# Patient Record
Sex: Female | Born: 1980 | Race: White | Hispanic: No | Marital: Single | Smoking: Never smoker
Health system: Southern US, Community
[De-identification: ages and names within clinical notes are randomized; demographics above are authoritative.]

## PROBLEM LIST (undated history)

## (undated) DIAGNOSIS — E079 Disorder of thyroid, unspecified: Secondary | ICD-10-CM

## (undated) DIAGNOSIS — E7800 Pure hypercholesterolemia, unspecified: Secondary | ICD-10-CM

---

## 2005-10-27 ENCOUNTER — Other Ambulatory Visit: Admission: RE | Admit: 2005-10-27 | Discharge: 2005-10-27 | Payer: Self-pay | Admitting: Obstetrics and Gynecology

## 2010-10-08 ENCOUNTER — Inpatient Hospital Stay (HOSPITAL_COMMUNITY)
Admission: RE | Admit: 2010-10-08 | Discharge: 2010-10-10 | Payer: Self-pay | Source: Home / Self Care | Attending: Obstetrics and Gynecology | Admitting: Obstetrics and Gynecology

## 2010-10-08 LAB — CBC
HCT: 35.3 % — ABNORMAL LOW (ref 36.0–46.0)
Hemoglobin: 11.7 g/dL — ABNORMAL LOW (ref 12.0–15.0)
MCHC: 33.1 g/dL (ref 30.0–36.0)
RBC: 4.11 MIL/uL (ref 3.87–5.11)

## 2010-10-08 LAB — RPR: RPR Ser Ql: NONREACTIVE

## 2010-10-09 LAB — ABO/RH: ABO/RH(D): O POS

## 2010-10-09 LAB — CBC
HCT: 27 % — ABNORMAL LOW (ref 36.0–46.0)
Hemoglobin: 9.1 g/dL — ABNORMAL LOW (ref 12.0–15.0)
RBC: 3.16 MIL/uL — ABNORMAL LOW (ref 3.87–5.11)
WBC: 11.8 10*3/uL — ABNORMAL HIGH (ref 4.0–10.5)

## 2010-10-14 NOTE — Discharge Summary (Signed)
NAMEThurman Hickman NO.:  [id number]  MEDICAL RECORD NO.:  [id number]          PATIENT TYPE:  INP  LOCATION:  9125                          FACILITY:  [facility]  PHYSICIAN:  Malachi Pro. Ambrose Mantle, M.D. DATE OF BIRTH:  01-Feb-1981  DATE OF ADMISSION:  10/08/2010 DATE OF DISCHARGE:  10/10/2010                              DISCHARGE SUMMARY   This is a 30 year old white female para 0, gravida 1 at [redacted] weeks gestation, [facility] October 08, 2010 by last period compatible with an 8-week ultrasound presented to Labor and Delivery for induction of labor because of term status and recently favorable cervix.  Prenatal care was uneventful except for positive group B strep status.  She failed a 1- hour Glucola test.  The 3-hour GTT was completely normal.  She did smoke with pregnancy with no more than 2 cigarettes a day.  Blood group and type O+ with negative antibody, rubella immune, RPR nonreactive, hepatitis B surface antigen negative, HIV negative, GC and Chlamydia negative, cystic fibrosis declined, AFP negative.  First trimester screen negative, 1-hour Glucola 147, 3-hour GTT 82, 124, 115, and 102. Group B strep was positive.  She had a history of abnormal paps that resolved.  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  She did have a history of scoliosis.  She had had a myringotomy and tonsillectomy.  She used tobacco intermittently.  She did not drink or take drugs.  She was a Runner, broadcasting/film/video  FAMILY HISTORY:  Grandmother with colon cancer; father, heart disease, died from an MI.  Father and brother had increased cholesterol.  She had no known drug allergies.  MEDICATIONS:  Prenatal vitamins.  On admission, she was afebrile with normal vital signs.  Heart and lungs were normal.  The abdomen was gravid and nontender.  Cervix was a tight 2 cm, 50% vertex at -1.  Artificial rupture of membranes produced probable clear fluid.  She was on group B strep prophylaxis with penicillin.  She was  begun on Pitocin.  By 1:30 p.m., her cervix was 2-3 cm, 75% effaced.  By 6:50 p.m., she had gotten uncomfortable and received an epidural and at 6:00 p.m. was 6 cm.  She did have variable decelerations to 70-80 but decreased blood pressure from the epidural, improved with position change and ephedrine.  At 6:50 p.m., she was 9 cm, fully effaced and vertex at  0 to +1 station.  She progressed to complete dilatation and pushed great for approximately 30 minutes with a spontaneous delivery of a vigorous female infant weighing 6 pounds 8 ounces, Apgars of 8 and 9 at 1 and 5 minutes.  Dr. Senaida Ores was in attendance.  Cord blood was collected and the placenta was delivered spontaneously.  Second-degree laceration noted and repaired with 2-0 and 3-0 Vicryl.  Two additional sutures were placed on the inner right labia for an arterial bleeder with good hemostasis.  Cervix and rectum were intact.  Blood loss about 450 mL.  Postpartum, the patient did well and was discharged on the second postpartum day.  Initial hemoglobin was 11.7, hematocrit 35.3, white count 8900, platelet count 217,000. Followup hemoglobin  9.1, hematocrit 27.0, platelet count of 191,000.  FINAL DIAGNOSIS:  Intrauterine pregnancy at 40 weeks, delivered vertex.  OPERATION:  Spontaneous delivery vertex, repair of second-degree laceration and right labial laceration.  FINAL CONDITION:  Improved.  Instructions include our regular discharge instruction booklet.  The patient is given a prescription for Motrin 600 mg, 30 tablets 1 every 6 hours as needed for pain and Percocet 5/325, 30 tablets 1 every 4-6 hours as needed for pain.  She is advised to take ferrous sulfate 325 mg 2 tablets daily and return to the office in 6 weeks for followup examination.     Malachi Pro. Ambrose Mantle, M.D.     TFH/MEDQ  D:  10/10/2010  T:  10/11/2010  Job:  [id number]  Electronically Signed by Tracey Harries M.D. on 10/14/2010 08:36:37 AM

## 2011-01-20 ENCOUNTER — Emergency Department (HOSPITAL_COMMUNITY)
Admission: EM | Admit: 2011-01-20 | Discharge: 2011-01-20 | Disposition: A | Discharge status: Home / Self Care | Payer: BC Managed Care – PPO | Attending: Emergency Medicine | Admitting: Emergency Medicine

## 2011-01-20 DIAGNOSIS — M7989 Other specified soft tissue disorders: Secondary | ICD-10-CM

## 2011-01-20 DIAGNOSIS — R109 Unspecified abdominal pain: Secondary | ICD-10-CM | POA: Insufficient documentation

## 2011-01-20 DIAGNOSIS — M25569 Pain in unspecified knee: Secondary | ICD-10-CM | POA: Insufficient documentation

## 2011-01-20 DIAGNOSIS — M549 Dorsalgia, unspecified: Secondary | ICD-10-CM | POA: Insufficient documentation

## 2011-01-20 LAB — COMPREHENSIVE METABOLIC PANEL
AST: 31 U/L (ref 0–37)
Albumin: 4.3 g/dL (ref 3.5–5.2)
BUN: 9 mg/dL (ref 6–23)
Creatinine, Ser: 0.75 mg/dL (ref 0.4–1.2)
GFR calc Af Amer: >60 mL/min (ref >60)
Potassium: 3.5 mEq/L (ref 3.5–5.1)
Total Protein: 7.5 g/dL (ref 6.0–8.3)

## 2011-01-20 LAB — POCT PREGNANCY, URINE: Preg Test, Ur: NEGATIVE

## 2011-01-20 LAB — DIFFERENTIAL
Eosinophils Absolute: 0.3 10*3/uL (ref 0.0–0.7)
Lymphocytes Relative: 46 % (ref 12–46)
Lymphs Abs: 2.8 10*3/uL (ref 0.7–4.0)
Monocytes Relative: 7 % (ref 3–12)
Neutro Abs: 2.6 10*3/uL (ref 1.7–7.7)
Neutrophils Relative %: 43 % (ref 43–77)

## 2011-01-20 LAB — CBC
Hemoglobin: 13.8 g/dL (ref 12.0–15.0)
MCH: 27.7 pg (ref 26.0–34.0)
MCV: 85 fL (ref 78.0–100.0)
Platelets: 269 10*3/uL (ref 150–400)
RBC: 4.99 MIL/uL (ref 3.87–5.11)
WBC: 6.1 10*3/uL (ref 4.0–10.5)

## 2011-01-20 LAB — URINALYSIS, ROUTINE W REFLEX MICROSCOPIC
Bilirubin Urine: NEGATIVE
Glucose, UA: NEGATIVE mg/dL
Leukocytes, UA: NEGATIVE
Nitrite: NEGATIVE
Specific Gravity, Urine: 1.009 (ref 1.005–1.030)
pH: 6.5 (ref 5.0–8.0)

## 2011-01-20 LAB — URINE MICROSCOPIC-ADD ON

## 2021-01-03 ENCOUNTER — Emergency Department (HOSPITAL_BASED_OUTPATIENT_CLINIC_OR_DEPARTMENT_OTHER)
Admission: EM | Admit: 2021-01-03 | Discharge: 2021-01-03 | Disposition: A | Discharge status: Home / Self Care | Payer: BC Managed Care – PPO | Attending: Emergency Medicine | Admitting: Emergency Medicine

## 2021-01-03 ENCOUNTER — Other Ambulatory Visit: Payer: Self-pay

## 2021-01-03 ENCOUNTER — Encounter (HOSPITAL_BASED_OUTPATIENT_CLINIC_OR_DEPARTMENT_OTHER): Payer: Self-pay | Admitting: Emergency Medicine

## 2021-01-03 ENCOUNTER — Emergency Department (HOSPITAL_BASED_OUTPATIENT_CLINIC_OR_DEPARTMENT_OTHER): Payer: BC Managed Care – PPO

## 2021-01-03 DIAGNOSIS — Z23 Encounter for immunization: Secondary | ICD-10-CM | POA: Insufficient documentation

## 2021-01-03 DIAGNOSIS — S61211A Laceration without foreign body of left index finger without damage to nail, initial encounter: Secondary | ICD-10-CM

## 2021-01-03 DIAGNOSIS — W268XXA Contact with other sharp object(s), not elsewhere classified, initial encounter: Secondary | ICD-10-CM | POA: Insufficient documentation

## 2021-01-03 DIAGNOSIS — S6992XA Unspecified injury of left wrist, hand and finger(s), initial encounter: Secondary | ICD-10-CM | POA: Diagnosis present

## 2021-01-03 HISTORY — DX: Pure hypercholesterolemia, unspecified: E78.00

## 2021-01-03 HISTORY — DX: Disorder of thyroid, unspecified: E07.9

## 2021-01-03 MED ORDER — TETANUS-DIPHTH-ACELL PERTUSSIS 5-2.5-18.5 LF-MCG/0.5 IM SUSY
0.5000 mL | PREFILLED_SYRINGE | Freq: Once | INTRAMUSCULAR | Status: AC
  Administered 2021-01-03: 0.5 mL via INTRAMUSCULAR
  Filled 2021-01-03: qty 0.5

## 2021-01-03 MED ORDER — LIDOCAINE HCL (PF) 1 % IJ SOLN
5.0000 mL | Freq: Once | INTRAMUSCULAR | Status: AC
  Administered 2021-01-03: 5 mL
  Filled 2021-01-03: qty 5

## 2021-01-03 NOTE — ED Provider Notes (Signed)
[facility] EMERGENCY DEPARTMENT Provider Note   CSN: [id number] Arrival date & time: 01/03/21  1932     History Chief Complaint  Patient presents with  . Extremity Laceration    Kimberly Hickman is a 40 y.o. female with a past medical history significant for hyperlipidemia and thyroid disease who presents to the ED due to finger laceration.  Patient states she was cutting the bushes causing a laceration to left index finger.  Patient rates her pain a 3/10 worse with movement. No treatment prior to arrival. No nailbed injury. Last tetanus shot roughly 10 years ago. Admits to mild tingling around laceration.   History obtained from patient and past medical records. No interpreter used during encounter.      Past Medical History:  Diagnosis Date  . Elevated cholesterol   . Thyroid disease     There are no problems to display for this patient.   History reviewed. No pertinent surgical history.   OB History   No obstetric history on file.     No family history on file.  Social History   Tobacco Use  . Smoking status: Never Smoker  . Smokeless tobacco: Never Used  Vaping Use  . Vaping Use: Never used  Substance Use Topics  . Alcohol use: Yes    Alcohol/week: 2.0 standard drinks    Types: 2 Cans of beer per week    Comment: daily  . Drug use: Never    Home Medications Prior to Admission medications   Not on File    Allergies    Patient has no allergy information on record.  Review of Systems   Review of Systems  Musculoskeletal: Positive for arthralgias.  Skin: Positive for wound.  All other systems reviewed and are negative.   Physical Exam Updated Vital Signs BP 135/82 (BP Location: Right Arm)   Pulse 64   Temp 98 F (36.7 C) (Oral)   Resp 18   Ht 5\' 5"  (1.651 m)   Wt 83.9 kg   LMP 12/10/2020   SpO2 100%   BMI 30.79 kg/m   Physical Exam Vitals and nursing note reviewed.  Constitutional:      General: She is not in acute  distress.    Appearance: She is not ill-appearing.  HENT:     Head: Normocephalic.  Eyes:     Pupils: Pupils are equal, round, and reactive to light.  Cardiovascular:     Rate and Rhythm: Normal rate and regular rhythm.     Pulses: Normal pulses.     Heart sounds: Normal heart sounds. No murmur heard. No friction rub. No gallop.   Pulmonary:     Effort: Pulmonary effort is normal.     Breath sounds: Normal breath sounds.  Abdominal:     General: Abdomen is flat. There is no distension.     Palpations: Abdomen is soft.     Tenderness: There is no abdominal tenderness. There is no guarding or rebound.  Musculoskeletal:        General: Normal range of motion.     Cervical back: Neck supple.     Comments: Full ROM of left index finger and all fingers. Full ROM of left wrist. Radial pulse palpable.   Skin:    General: Skin is warm and dry.     Comments: 3cm laceration to palmar aspect of left index finger that wraps around. No nailbed injury.   Neurological:     General: No focal deficit present.  Mental Status: She is alert.  Psychiatric:        Mood and Affect: Mood normal.        Behavior: Behavior normal.         ED Results / Procedures / Treatments   Labs (all labs ordered are listed, but only abnormal results are displayed) Labs Reviewed - No data to display  EKG None  Radiology DG Finger Index Left  Result Date: 01/03/2021 CLINICAL DATA:  Laceration to the left index finger on the palmar surface. EXAM: LEFT INDEX FINGER 2+V COMPARISON:  None. FINDINGS: Soft tissue defect over the volar aspect of the distal left second finger consistent with history of laceration. No radiopaque soft tissue foreign bodies. Underlying bones appear intact. No evidence of acute fracture or dislocation. Joint spaces are normal. IMPRESSION: Soft tissue laceration. No radiopaque soft tissue foreign bodies. No acute bony abnormalities. Electronically Signed   By: Burman Nieves M.D.    On: 01/03/2021 20:45    Procedures .Marland KitchenLaceration Repair  Date/Time: 01/03/2021 9:30 PM Performed by: Mannie Stabile, PA-C Authorized by: Mannie Stabile, PA-C   Consent:    Consent obtained:  Verbal   Consent given by:  Patient   Risks discussed:  Infection, need for additional repair, pain, poor cosmetic result and poor wound healing   Alternatives discussed:  No treatment and delayed treatment Universal protocol:    Procedure explained and questions answered to patient or proxy's satisfaction: yes     Relevant documents present and verified: yes     Test results available: yes     Imaging studies available: yes     Required blood products, implants, devices, and special equipment available: yes     Site/side marked: yes     Immediately prior to procedure, a time out was called: yes     Patient identity confirmed:  Verbally with patient Anesthesia:    Anesthesia method:  Local infiltration   Local anesthetic:  Lidocaine 1% w/o epi Laceration details:    Location:  Finger   Finger location:  L index finger   Length (cm):  3   Depth (mm):  2 Pre-procedure details:    Preparation:  Patient was prepped and draped in usual sterile fashion and imaging obtained to evaluate for foreign bodies Exploration:    Limited defect created (wound extended): no     Hemostasis achieved with:  Direct pressure   Imaging obtained: x-ray     Imaging outcome: foreign body not noted     Wound exploration: wound explored through full range of motion and entire depth of wound visualized     Wound extent: no foreign bodies/material noted, no tendon damage noted and no underlying fracture noted     Contaminated: no   Treatment:    Area cleansed with:  Saline   Amount of cleaning:  Standard   Irrigation solution:  Sterile saline   Irrigation volume:  250   Irrigation method:  Syringe   Visualized foreign bodies/material removed: no   Skin repair:    Repair method:  Sutures   Suture size:   4-0   Suture material:  Prolene   Number of sutures:  4 Approximation:    Approximation:  Close Repair type:    Repair type:  Intermediate Post-procedure details:    Dressing:  Non-adherent dressing and splint for protection   Procedure completion:  Tolerated well, no immediate complications     Medications Ordered in ED Medications  Tdap (BOOSTRIX) injection 0.5  mL (0.5 mLs Intramuscular Given 01/03/21 2007)  lidocaine (PF) (XYLOCAINE) 1 % injection 5 mL (5 mLs Infiltration Given by Other 01/03/21 2011)    ED Course  I have reviewed the triage vital signs and the nursing notes.  Pertinent labs & imaging results that were available during my care of the patient were reviewed by me and considered in my medical decision making (see chart for details).    MDM Rules/Calculators/A&P                         40 year old female presents to the ED due to left index finger laceration that occurred while cutting bushes. Stable vitals. 3cm laceration to palmar portion of left index finger. Full ROM. Low suspicion for tendon involvement. No nailbed involvement. X-ray personally reviewed which is negative for any acute abnormalities. Laceration thoroughly cleaned and suture repair performed as noted above. Tetanus updated.  Instructed patient to take over-the-counter ibuprofen or Tylenol as needed for pain.  Hand surgery number given to patient at discharge.  Instructed patient to follow-up with hand surgeon within the next week for further evaluation. Strict ED precautions discussed with patient. Patient states understanding and agrees to plan. Patient discharged home in no acute distress and stable vitals.  Final Clinical Impression(s) / ED Diagnoses Final diagnoses:  Laceration of left index finger without foreign body without damage to nail, initial encounter    Rx / DC Orders ED Discharge Orders    None       Jesusita Oka 01/03/21 2133    Charlynne Pander,  MD 01/04/21 [phone]

## 2021-01-03 NOTE — Discharge Instructions (Addendum)
You will need your sutures removed in 10 to 14 days.  I have included the number of the hand surgeon.  Please call to schedule an appointment for further evaluation.  You may take over-the-counter ibuprofen or Tylenol as needed for pain.  Return to the ER for new or worsening symptoms.

## 2021-01-03 NOTE — ED Notes (Signed)
Finger splint placed on left hand pointer finger by Trinna Post, ED Tech.

## 2021-01-03 NOTE — ED Triage Notes (Signed)
Reports she cut her left pointer finger while cutting the bushes.  Bleeding controlled currently.  Reports tetanus in last ten years.

## 2021-07-16 ENCOUNTER — Other Ambulatory Visit: Payer: Self-pay | Admitting: Obstetrics and Gynecology

## 2021-07-16 DIAGNOSIS — R928 Other abnormal and inconclusive findings on diagnostic imaging of breast: Secondary | ICD-10-CM

## 2021-08-10 ENCOUNTER — Ambulatory Visit
Admission: RE | Admit: 2021-08-10 | Discharge: 2021-08-10 | Disposition: A | Discharge status: Home / Self Care | Payer: BC Managed Care – PPO | Source: Ambulatory Visit | Attending: Obstetrics and Gynecology | Admitting: Obstetrics and Gynecology

## 2021-08-10 ENCOUNTER — Ambulatory Visit: Payer: BC Managed Care – PPO

## 2021-08-10 DIAGNOSIS — R928 Other abnormal and inconclusive findings on diagnostic imaging of breast: Secondary | ICD-10-CM

## 2023-05-30 IMAGING — MG MM DIGITAL DIAGNOSTIC UNILAT*R* W/ TOMO W/ CAD
6 of 10 series · 6 of 30 positions shown · non-contrast
Comparison: Baseline mammogram 07/13/2021.

CLINICAL DATA: Recall from baseline screening mammography, possible
asymmetries in the RIGHT breast.

EXAM:
DIGITAL DIAGNOSTIC UNILATERAL RIGHT MAMMOGRAM WITH TOMOSYNTHESIS AND
CAD
TECHNIQUE: Right digital diagnostic mammography and breast tomosynthesis was
performed. The images were evaluated with computer-aided detection.

[R CC synth-2D (1 of 2)]
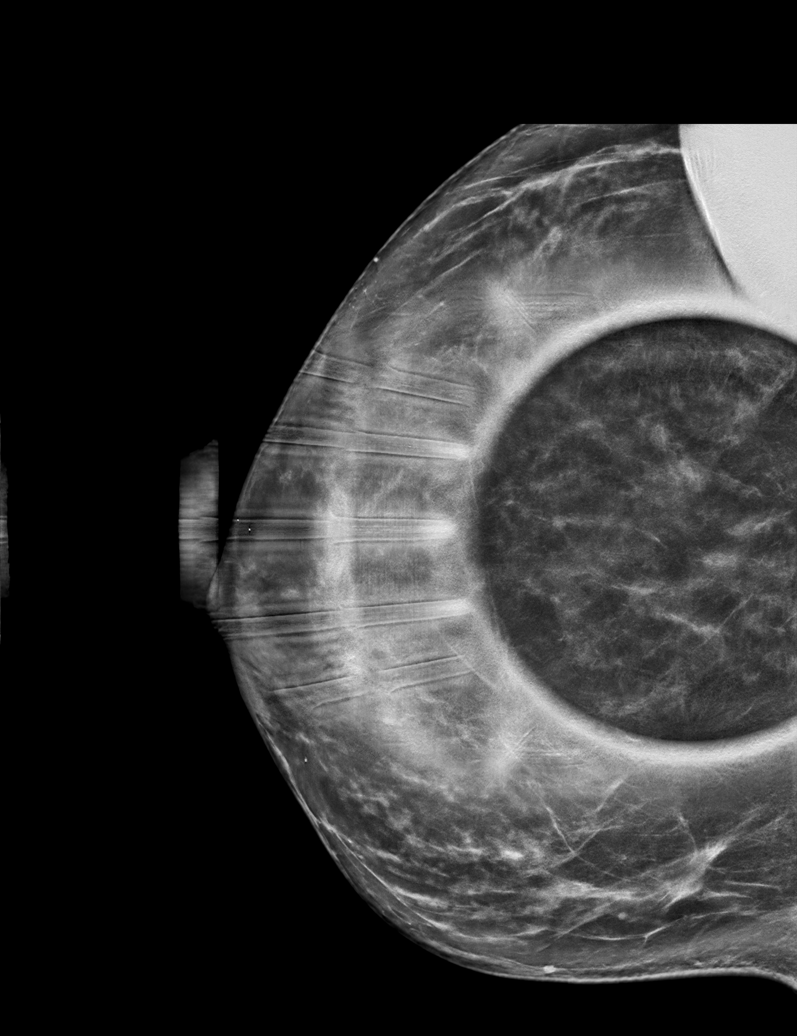

[R ML synth-2D]
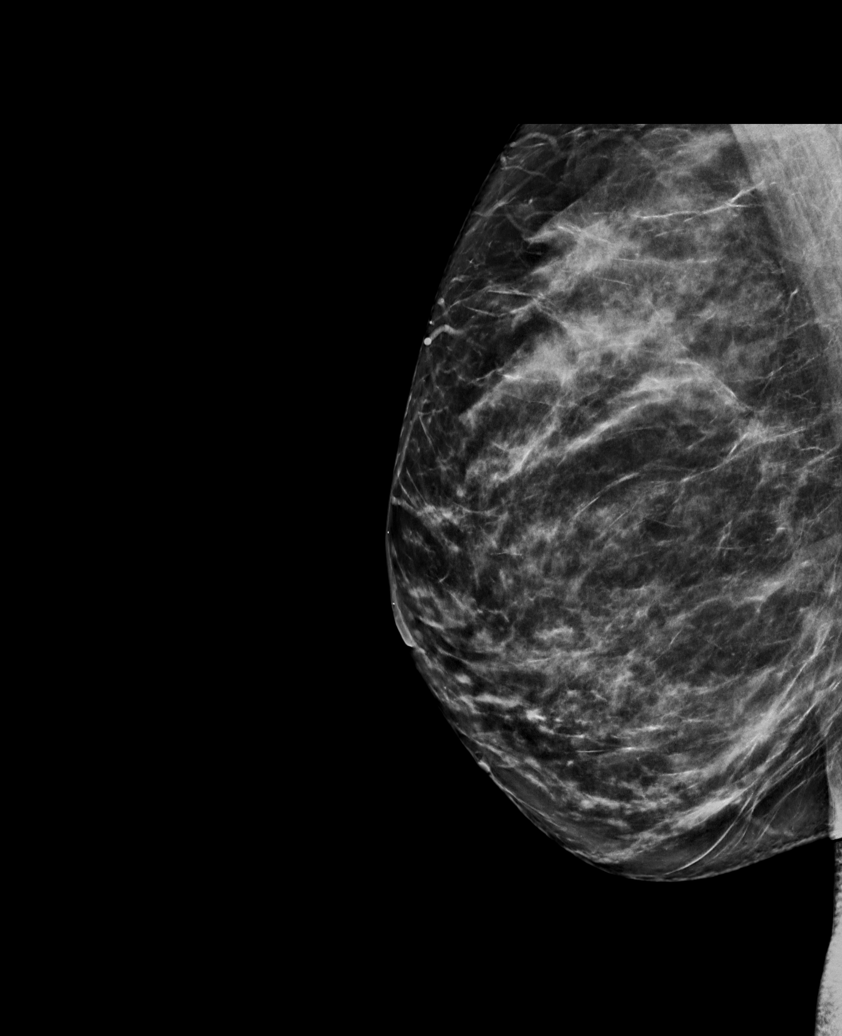

[R CC synth-2D (2 of 2)]
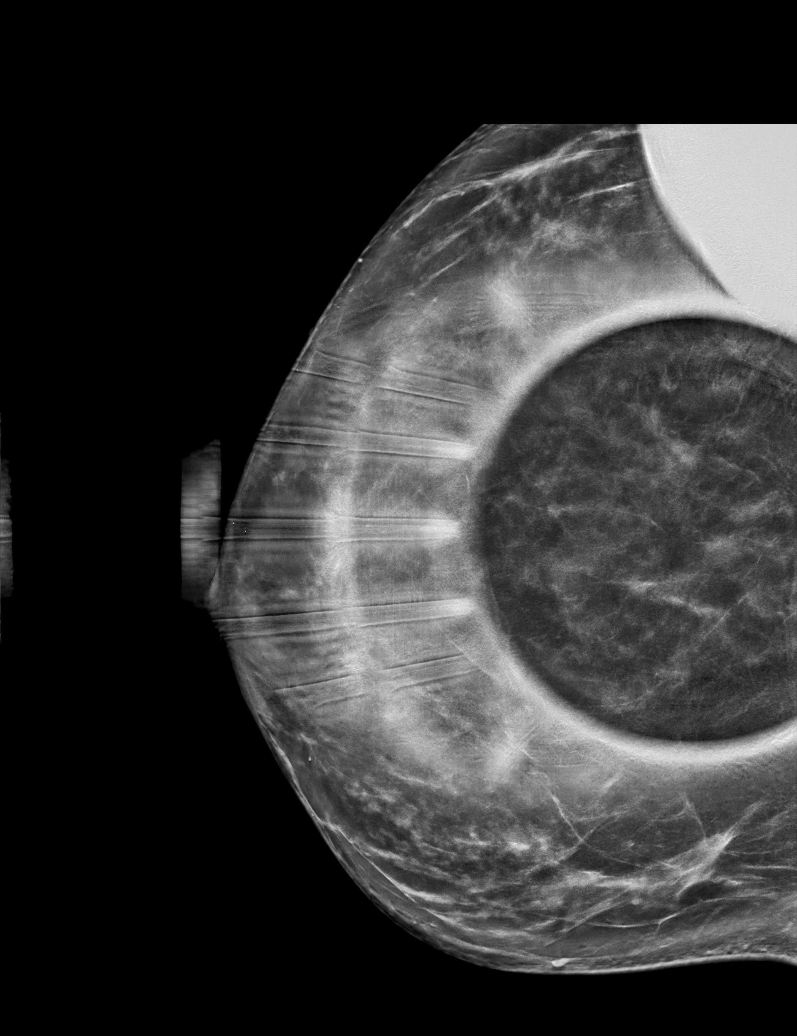

[R MLO synth-2D (1 of 2)]
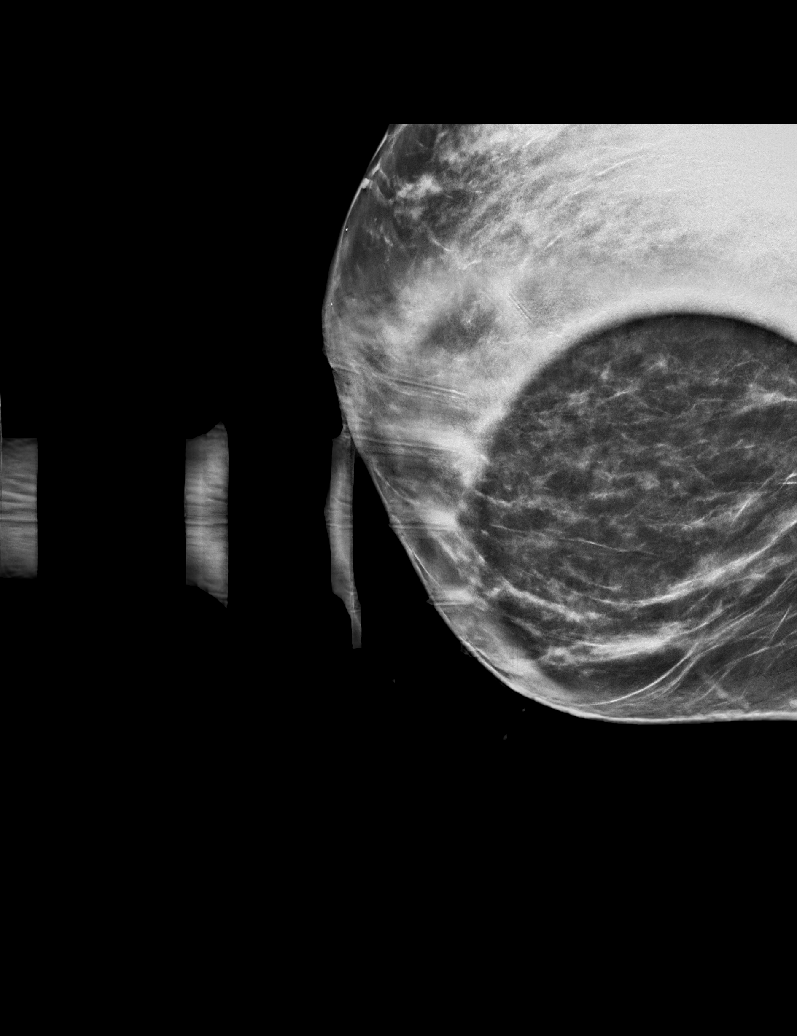

[R MLO synth-2D (2 of 2)]
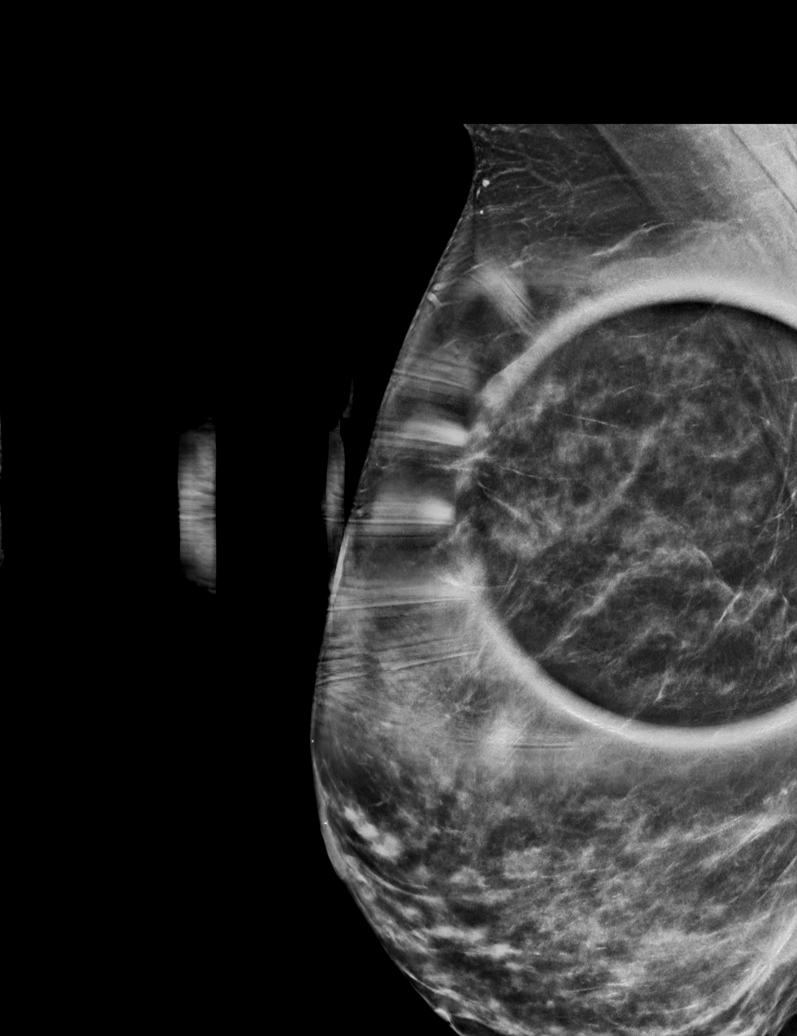

[R MLO tomo · tomo slice 38/75.0]
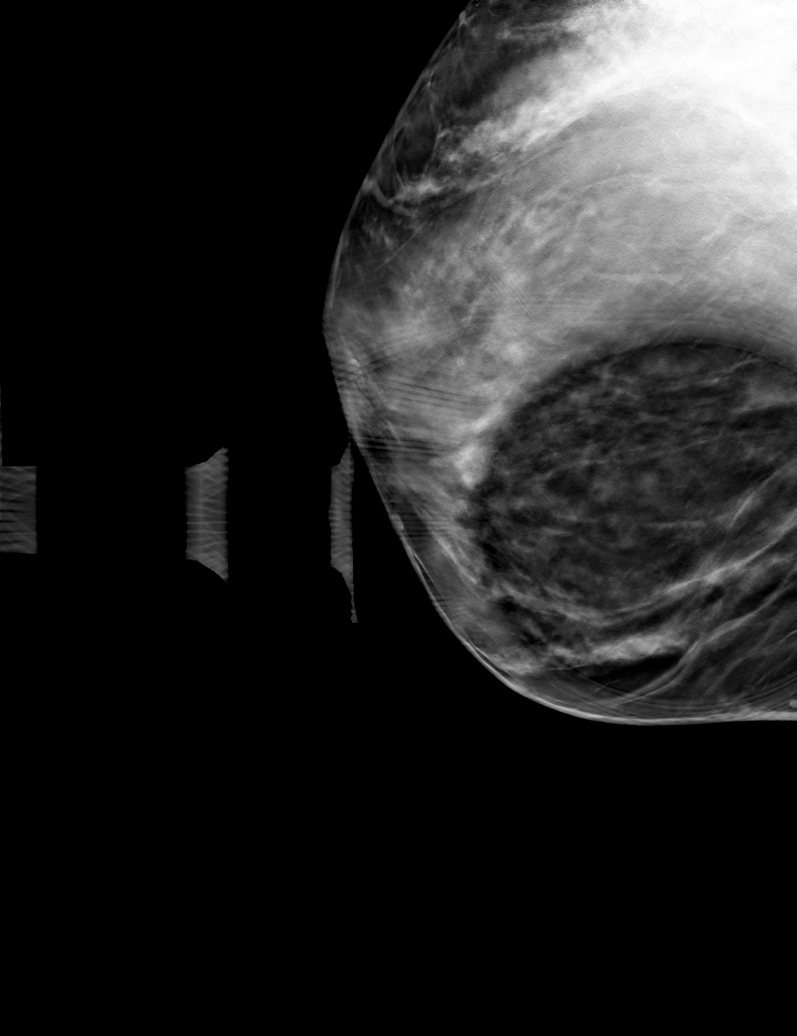

[6 of 30 positions shown; findings below may reference images not displayed]

ACR Breast Density Category c: The breast tissue is heterogeneously
dense, which may obscure small masses.
FINDINGS: Spot-compression CC and MLO views of the area of concern and a full
field mediolateral view were obtained.

The focal asymmetry in upper breast at posterior depth and in the
asymmetry in the lower breast at middle depth both disperse upon
compression, indicating overlapping fibroglandular tissue. There is
no underlying mass or architectural distortion at either location.

No findings suspicious for malignancy.
IMPRESSION: No mammographic evidence of malignancy involving the RIGHT breast.

RECOMMENDATION:
Screening mammogram in one year.(Code:[id number]-[id number])

I have discussed the findings and recommendations with the patient.
If applicable, a reminder letter will be sent to the patient
regarding the next appointment.

BI-RADS CATEGORY  1: Negative.

## 2023-12-15 ENCOUNTER — Other Ambulatory Visit: Payer: Self-pay

## 2023-12-15 ENCOUNTER — Ambulatory Visit: Payer: 59 | Attending: Obstetrics and Gynecology | Admitting: Physical Therapy

## 2023-12-15 DIAGNOSIS — R293 Abnormal posture: Secondary | ICD-10-CM | POA: Diagnosis present

## 2023-12-15 DIAGNOSIS — M6281 Muscle weakness (generalized): Secondary | ICD-10-CM | POA: Insufficient documentation

## 2023-12-15 DIAGNOSIS — M62838 Other muscle spasm: Secondary | ICD-10-CM | POA: Insufficient documentation

## 2023-12-15 DIAGNOSIS — R279 Unspecified lack of coordination: Secondary | ICD-10-CM | POA: Insufficient documentation

## 2023-12-15 NOTE — Therapy (Signed)
 OUTPATIENT PHYSICAL THERAPY FEMALE PELVIC EVALUATION   Patient Name: Kimberly Hickman MRN: [id number] DOB:18-Oct-1980, 43 y.o., female Today's Date: 12/15/2023  END OF SESSION:  PT End of Session - 12/15/23 1452     Visit Number 1    Date for PT Re-Evaluation 06/15/24    Authorization Type [employer]    PT Start Time 1446    PT Stop Time 1528    PT Time Calculation (min) 42 min    Activity Tolerance Patient tolerated treatment well    Behavior During Therapy WFL for tasks assessed/performed             Past Medical History:  Diagnosis Date   Elevated cholesterol    Thyroid disease    No past surgical history on file. There are no active problems to display for this patient.   PCP: Laqueta Due., MD   REFERRING PROVIDER: Arby Barrette, FNP  REFERRING DIAG: N39.3 (ICD-10-CM) - Stress incontinence (female) (female)  THERAPY DIAG:  Muscle weakness (generalized)  Abnormal posture  Unspecified lack of coordination  Other muscle spasm  Rationale for Evaluation and Treatment: Rehabilitation  ONSET DATE: 3 years ago  SUBJECTIVE:                                                                                                                                                                                           SUBJECTIVE STATEMENT: Urinary incontinence with sneezing, coughing, working out with heavy weights(100# squats), squatting, jumping  Fluid intake: water - 64-90oz; coffee thermos once per day  PAIN:  Are you having pain? No   PRECAUTIONS: None  RED FLAGS: None   WEIGHT BEARING RESTRICTIONS: No  FALLS:  Has patient fallen in last 6 months? No  OCCUPATION: kindergarten teacher   ACTIVITY LEVEL : active 6 days working out  PLOF: Independent  PATIENT GOALS: to have less leakage  PERTINENT HISTORY:  1 vaginal birth Sexual abuse: No  BOWEL MOVEMENT: Pain with bowel movement: No Type of bowel movement:Type (Bristol Stool Scale) 4, Frequency  daily, and Strain no Fully empty rectum: Yes:   Leakage: No Pads: No Fiber supplement/laxative No  URINATION: Pain with urination: No Fully empty bladder: Yes:   Stream: Strong Urgency: No Frequency: not quicker than every 2 hours, not nightly usually sometimes 1x Leakage: Coughing, Sneezing, Laughing, Exercise, Lifting, and Intercourse Pads: Yes: liners changed with going to the bathroom but this for more hygiene not leakage for work but for cardio dayswill have leakage and need larger pad  INTERCOURSE:  Ability to have vaginal penetration No  Pain with intercourse: none DrynessNo Climax: not painful Marinoff Scale: 0/3  PREGNANCY: Vaginal deliveries 1 Tearing Yes: thinks grade 3 almost 4  Episiotomy No C-section deliveries 0 Currently pregnant No  PROLAPSE: None   OBJECTIVE:  Note: Objective measures were completed at Evaluation unless otherwise noted.  DIAGNOSTIC FINDINGS:     COGNITION: Overall cognitive status: Within functional limits for tasks assessed     SENSATION: Light touch: Appears intact  FUNCTIONAL TESTS:  Functional squat - noted breath holding with return to standing and minimal core activation   GAIT: WFL  POSTURE: rounded shoulders and forward head   LUMBARAROM/PROM:  A/PROM A/PROM  eval  Flexion WFL  Extension WFL  Right lateral flexion WFL  Left lateral flexion WLF  Right rotation Limited by 25%  Left rotation Limited by 25%   (Blank rows = not tested)  LOWER EXTREMITY ROM:  Bil hamstrings and adductors limited by<25%  LOWER EXTREMITY MMT:  WFL but all with noted breath holding and mild upper abdominal bulge PALPATION:   General: no TTP but tight bil lumbar paraspinals  Pelvic Alignment: WFL  Abdominal: no TTP but DRA separation noted with active crunch see chart.                 External Perineal Exam: no TTP                             Internal Pelvic Floor: no TTP, WFL  Patient confirms identification and  approves PT to assess internal pelvic floor and treatment Yes No emotional/communication barriers or cognitive limitation. Patient is motivated to learn. Patient understands and agrees with treatment goals and plan. PT explains patient will be examined in standing, sitting, and lying down to see how their muscles and joints work. When they are ready, they will be asked to remove their underwear so PT can examine their perineum. The patient is also given the option of providing their own chaperone as one is not provided in our facility. The patient also has the right and is explained the right to defer or refuse any part of the evaluation or treatment including the internal exam. With the patient's consent, PT will use one gloved finger to gently assess the muscles of the pelvic floor, seeing how well it contracts and relaxes and if there is muscle symmetry. After, the patient will get dressed and PT and patient will discuss exam findings and plan of care. PT and patient discuss plan of care, schedule, attendance policy and HEP activities.  PELVIC MMT:   MMT eval  Vaginal 2-3/5, 2s, 4 reps  Internal Anal Sphincter   External Anal Sphincter   Puborectalis   Diastasis Recti 1 finger separation below umbilicus, 1.5-2 above with bulge noted with crunch   (Blank rows = not tested)        TONE: WFL  PROLAPSE: Not seen in cough in hooklying   TODAY'S TREATMENT:  DATE:   12/15/23 EVAL Examination completed, findings reviewed, pt educated on POC, HEP, and cuing and education for improved transverse abdominis activation to decreased DRA separation with core workouts and decreased bulge/doming noted. Pt benefited from mod cues but then able to maintain and no doming noted with crunch. Pt also benefited from cues and extra time for proper pelvic floor mechanics with contraction and  decreased breath holding. Pt motivated to participate in PT and agreeable to attempt recommendations.     PATIENT EDUCATION:  Education details: [id number]  Person educated: Patient Education method: Explanation, Demonstration, Tactile cues, Verbal cues, and Handouts Education comprehension: verbalized understanding, returned demonstration, verbal cues required, tactile cues required, and needs further education  HOME EXERCISE PROGRAM: [id number]   ASSESSMENT:  CLINICAL IMPRESSION: Patient is a 43 y.o. female  who was seen today for physical therapy evaluation and treatment for stress urinary incontinence. Pt very active working out 6 days a week and does do moderate heavy weight training with ~100# squats for example. Pt reports she also does cardio days and upper body and core days, has most leakage with cardio and needs to wear large pad for this, heavy weight training as well. Pt does demonstrate breath holding with all MMTs and attempts to contract pelvic floor and reports she knows she does this with strengthening as well. Pt found to have decreased flexibility in spine and hips and core weakness with DRA separation noted as well. Pt consented to internal vaginal pelvic floor assessment and had decreased strength, endurance, coordination. Details above. Benefited from cues with coordination of breathing and mechanics for proper techniques with contractions. Pt would benefit from additional PT to further address deficits.    OBJECTIVE IMPAIRMENTS: decreased activity tolerance, decreased coordination, decreased endurance, decreased mobility, decreased strength, increased fascial restrictions, increased muscle spasms, impaired flexibility, improper body mechanics, and postural dysfunction.   ACTIVITY LIMITATIONS: continence  PARTICIPATION LIMITATIONS: community activity  PERSONAL FACTORS: Time since onset of injury/illness/exacerbation and 1 comorbidity: 1 vaginal birth  are also affecting  patient's functional outcome.   REHAB POTENTIAL: Good  CLINICAL DECISION MAKING: Stable/uncomplicated  EVALUATION COMPLEXITY: Low   GOALS: Goals reviewed with patient? Yes  SHORT TERM GOALS: Target date: 01/12/24  Pt to be I with HEP.  Baseline: Goal status: INITIAL  2.  Pt to demonstrate improved coordination of pelvic floor and breathing mechanics with 20# squat with appropriate synergistic patterns to decrease pain and leakage at least 50% of the time.    Baseline:  Goal status: INITIAL  3.  Pt to be I with knack for decreased leakage with stressors.  Baseline:  Goal status: INITIAL  LONG TERM GOALS: Target date: 06/15/24  Pt to be I with advanced HEP.  Baseline:  Goal status: INITIAL  2.  Pt to demonstrate improved coordination of pelvic floor and breathing mechanics with 50# squat with appropriate synergistic patterns to decrease pain and leakage at least 75% of the time.    Baseline:  Goal status: INITIAL  3.  Pt to report no more than one instance of urinary incontinence in a week for improved confidence with working out.  Baseline:  Goal status: INITIAL  4.  Pt to decrease "just in case" urination to improve bladder habits for decreased frequency to within 2-4 hours.  Baseline:  Goal status: INITIAL  5.  Pt to demonstrate no restrictions with lumbar/pelvic mobility for decreased strain at pelvic floor.  Baseline:  Goal status: INITIAL    PLAN:  PT FREQUENCY: 1-2x/week  PT DURATION:  10 sessions  PLANNED INTERVENTIONS: 97110-Therapeutic exercises, 97530- Therapeutic activity, 97112- Neuromuscular re-education, 97535- Self Care, 76283- Manual therapy, Patient/Family education, Taping, Dry Needling, Joint mobilization, Spinal mobilization, Scar mobilization, DME instructions, Cryotherapy, Moist heat, and Biofeedback  PLAN FOR NEXT SESSION: coordination of pelvic floor with breathing and core strengthening; lifting mechanics to decrease breath holding.    Otelia Sergeant, PT, DPT 04/03/253:51 PM

## 2023-12-22 ENCOUNTER — Encounter: Payer: Self-pay | Admitting: Physical Therapy

## 2023-12-29 ENCOUNTER — Ambulatory Visit: Payer: Self-pay | Admitting: Physical Therapy

## 2023-12-29 DIAGNOSIS — R279 Unspecified lack of coordination: Secondary | ICD-10-CM

## 2023-12-29 DIAGNOSIS — M6281 Muscle weakness (generalized): Secondary | ICD-10-CM

## 2023-12-29 DIAGNOSIS — R293 Abnormal posture: Secondary | ICD-10-CM

## 2023-12-29 NOTE — Therapy (Signed)
 OUTPATIENT PHYSICAL THERAPY FEMALE PELVIC TREATMENT   Patient Name: Linley Moskal MRN: [id number] DOB:[birth year], 43 y.o., female Today's Date: 12/29/2023  END OF SESSION:  PT End of Session - 12/29/23 1447     Visit Number 2    Date for PT Re-Evaluation 06/15/24    Authorization Type [employer]    PT Start Time 1445    PT Stop Time 1526    PT Time Calculation (min) 41 min    Activity Tolerance Patient tolerated treatment well    Behavior During Therapy WFL for tasks assessed/performed             Past Medical History:  Diagnosis Date   Elevated cholesterol    Thyroid disease    No past surgical history on file. There are no active problems to display for this patient.   PCP: Naoma Bacca., MD   REFERRING PROVIDER: Lovell Rubenstein, FNP  REFERRING DIAG: N39.3 (ICD-10-CM) - Stress incontinence (female) (female)  THERAPY DIAG:  Muscle weakness (generalized)  Abnormal posture  Unspecified lack of coordination  Rationale for Evaluation and Treatment: Rehabilitation  ONSET DATE: 3 years ago  SUBJECTIVE:                                                                                                                                                                                           SUBJECTIVE STATEMENT: Pt reports urgency and frequency has been much lower. Has been very mindful with workout modifications and coordination of pelvic floor with activity. Does still have same amount of leakage with workouts but slightly better with coughing/sneezing.   Fluid intake: water - 64-90oz; coffee thermos once per day  PAIN:  Are you having pain? No   PRECAUTIONS: None  RED FLAGS: None   WEIGHT BEARING RESTRICTIONS: No  FALLS:  Has patient fallen in last 6 months? No  OCCUPATION: kindergarten teacher   ACTIVITY LEVEL : active 6 days working out  PLOF: Independent  PATIENT GOALS: to have less leakage  PERTINENT HISTORY:  1 vaginal birth Sexual abuse:  No  BOWEL MOVEMENT: Pain with bowel movement: No Type of bowel movement:Type (Bristol Stool Scale) 4, Frequency daily, and Strain no Fully empty rectum: Yes:   Leakage: No Pads: No Fiber supplement/laxative No  URINATION: Pain with urination: No Fully empty bladder: Yes:   Stream: Strong Urgency: No Frequency: not quicker than every 2 hours, not nightly usually sometimes 1x Leakage: Coughing, Sneezing, Laughing, Exercise, Lifting, and Intercourse Pads: Yes: liners changed with going to the bathroom but this for more hygiene not leakage for work but for cardio dayswill have leakage and need larger pad  INTERCOURSE:  Ability to have vaginal penetration No  Pain with intercourse: none DrynessNo Climax: not painful Marinoff Scale: 0/3  PREGNANCY: Vaginal deliveries 1 Tearing Yes: thinks grade 3 almost 4  Episiotomy No C-section deliveries 0 Currently pregnant No  PROLAPSE: None   OBJECTIVE:  Note: Objective measures were completed at Evaluation unless otherwise noted.  DIAGNOSTIC FINDINGS:     COGNITION: Overall cognitive status: Within functional limits for tasks assessed     SENSATION: Light touch: Appears intact  FUNCTIONAL TESTS:  Functional squat - noted breath holding with return to standing and minimal core activation   GAIT: WFL  POSTURE: rounded shoulders and forward head   LUMBARAROM/PROM:  A/PROM A/PROM  eval  Flexion WFL  Extension WFL  Right lateral flexion WFL  Left lateral flexion WLF  Right rotation Limited by 25%  Left rotation Limited by 25%   (Blank rows = not tested)  LOWER EXTREMITY ROM:  Bil hamstrings and adductors limited by<25%  LOWER EXTREMITY MMT:  WFL but all with noted breath holding and mild upper abdominal bulge PALPATION:   General: no TTP but tight bil lumbar paraspinals  Pelvic Alignment: WFL  Abdominal: no TTP but DRA separation noted with active crunch see chart.                 External Perineal  Exam: no TTP                             Internal Pelvic Floor: no TTP, WFL  Patient confirms identification and approves PT to assess internal pelvic floor and treatment Yes No emotional/communication barriers or cognitive limitation. Patient is motivated to learn. Patient understands and agrees with treatment goals and plan. PT explains patient will be examined in standing, sitting, and lying down to see how their muscles and joints work. When they are ready, they will be asked to remove their underwear so PT can examine their perineum. The patient is also given the option of providing their own chaperone as one is not provided in our facility. The patient also has the right and is explained the right to defer or refuse any part of the evaluation or treatment including the internal exam. With the patient's consent, PT will use one gloved finger to gently assess the muscles of the pelvic floor, seeing how well it contracts and relaxes and if there is muscle symmetry. After, the patient will get dressed and PT and patient will discuss exam findings and plan of care. PT and patient discuss plan of care, schedule, attendance policy and HEP activities.  PELVIC MMT:   MMT eval  Vaginal 2-3/5, 2s, 4 reps  Internal Anal Sphincter   External Anal Sphincter   Puborectalis   Diastasis Recti 1 finger separation below umbilicus, 1.5-2 above with bulge noted with crunch   (Blank rows = not tested)        TONE: WFL  PROLAPSE: Not seen in cough in hooklying   TODAY'S TREATMENT:  DATE:   12/15/23 EVAL Examination completed, findings reviewed, pt educated on POC, HEP, and cuing and education for improved transverse abdominis activation to decreased DRA separation with core workouts and decreased bulge/doming noted. Pt benefited from mod cues but then able to maintain and no doming  noted with crunch. Pt also benefited from cues and extra time for proper pelvic floor mechanics with contraction and decreased breath holding. Pt motivated to participate in PT and agreeable to attempt recommendations.    12/29/23 30# 2x10 squats 15# cables palloffs  2x10 10# opp hip flexion  2x10 15# marios 2x10 each X20 lateral hops 4x4 90 degree hops  20' forward and backward jumps each Pencil jumps x10 progressed to Jumping jacks x10 3x5 squat jumps  Reports roll ups to small jump causes leakage at workouts and this was replicated in slower pace (leakage reported with first rep, then cued for pelvic floor activation with roll up and no leakage reported, completed x2 more and no leakage)  PATIENT EDUCATION:  Education details: [id number]  Person educated: Patient Education method: Explanation, Demonstration, Tactile cues, Verbal cues, and Handouts Education comprehension: verbalized understanding, returned demonstration, verbal cues required, tactile cues required, and needs further education  HOME EXERCISE PROGRAM: [id number]   ASSESSMENT:  CLINICAL IMPRESSION: Patient is a 43 y.o. female  who was seen today for physical therapy evaluation and treatment for stress urinary incontinence.  Pt reports she has been implementing urge drill and this has helped with frequency, does still have stress incontinence. Pt session focused on coordination of pelvic floor with workout specific exercises that cause urinary incontinence. Pt tolerated well, benefited from moderate verbal cues for coordination with exertion but able to correct anything that caused leakage and only very minimal urinary incontinence noted. Once with roll up and once after 5th rep of squat jumps cued for stopping for quick flicks as needed and this resolved it with other two sets. Pt very motivated to improve and implement recommendations. Pt would benefit from additional PT to further address deficits.    OBJECTIVE IMPAIRMENTS:  decreased activity tolerance, decreased coordination, decreased endurance, decreased mobility, decreased strength, increased fascial restrictions, increased muscle spasms, impaired flexibility, improper body mechanics, and postural dysfunction.   ACTIVITY LIMITATIONS: continence  PARTICIPATION LIMITATIONS: community activity  PERSONAL FACTORS: Time since onset of injury/illness/exacerbation and 1 comorbidity: 1 vaginal birth  are also affecting patient's functional outcome.   REHAB POTENTIAL: Good  CLINICAL DECISION MAKING: Stable/uncomplicated  EVALUATION COMPLEXITY: Low   GOALS: Goals reviewed with patient? Yes  SHORT TERM GOALS: Target date: 01/12/24  Pt to be I with HEP.  Baseline: Goal status: INITIAL  2.  Pt to demonstrate improved coordination of pelvic floor and breathing mechanics with 20# squat with appropriate synergistic patterns to decrease pain and leakage at least 50% of the time.    Baseline:  Goal status: INITIAL  3.  Pt to be I with knack for decreased leakage with stressors.  Baseline:  Goal status: INITIAL  LONG TERM GOALS: Target date: 06/15/24  Pt to be I with advanced HEP.  Baseline:  Goal status: INITIAL  2.  Pt to demonstrate improved coordination of pelvic floor and breathing mechanics with 50# squat with appropriate synergistic patterns to decrease pain and leakage at least 75% of the time.    Baseline:  Goal status: INITIAL  3.  Pt to report no more than one instance of urinary incontinence in a week for improved confidence with working out.  Baseline:  Goal status:  INITIAL  4.  Pt to decrease "just in case" urination to improve bladder habits for decreased frequency to within 2-4 hours.  Baseline:  Goal status: INITIAL  5.  Pt to demonstrate no restrictions with lumbar/pelvic mobility for decreased strain at pelvic floor.  Baseline:  Goal status: INITIAL    PLAN:  PT FREQUENCY: 1-2x/week  PT DURATION:  10 sessions  PLANNED  INTERVENTIONS: 97110-Therapeutic exercises, 97530- Therapeutic activity, 97112- Neuromuscular re-education, 97535- Self Care, 16109- Manual therapy, Patient/Family education, Taping, Dry Needling, Joint mobilization, Spinal mobilization, Scar mobilization, DME instructions, Cryotherapy, Moist heat, and Biofeedback  PLAN FOR NEXT SESSION: coordination of pelvic floor with breathing and core strengthening; lifting mechanics to decrease breath holding.   Avie Lemme, PT, DPT 04/17/253:30 PM

## 2024-01-04 ENCOUNTER — Encounter: Payer: Self-pay | Admitting: Physical Therapy

## 2024-02-16 ENCOUNTER — Encounter: Payer: Self-pay | Admitting: Physical Therapy

## 2024-02-16 ENCOUNTER — Ambulatory Visit: Attending: Obstetrics and Gynecology | Admitting: Physical Therapy

## 2024-02-16 DIAGNOSIS — R293 Abnormal posture: Secondary | ICD-10-CM | POA: Diagnosis present

## 2024-02-16 DIAGNOSIS — M6281 Muscle weakness (generalized): Secondary | ICD-10-CM | POA: Diagnosis present

## 2024-02-16 DIAGNOSIS — R279 Unspecified lack of coordination: Secondary | ICD-10-CM | POA: Insufficient documentation

## 2024-02-16 NOTE — Therapy (Signed)
 OUTPATIENT PHYSICAL THERAPY FEMALE PELVIC TREATMENT   Patient Name: Kimberly Hickman MRN: [id number] DOB:07/22/1981, 43 y.o., female Today's Date: 02/16/2024  END OF SESSION:  PT End of Session - 02/16/24 1534     Visit Number 3    Date for PT Re-Evaluation 06/15/24    Authorization Type [employer]    PT Start Time 1532    PT Stop Time 1610    PT Time Calculation (min) 38 min    Activity Tolerance Patient tolerated treatment well    Behavior During Therapy WFL for tasks assessed/performed             Past Medical History:  Diagnosis Date   Elevated cholesterol    Thyroid disease    History reviewed. No pertinent surgical history. There are no active problems to display for this patient.   PCP: Naoma Bacca., MD   REFERRING PROVIDER: Lovell Rubenstein, FNP  REFERRING DIAG: N39.3 (ICD-10-CM) - Stress incontinence (female) (female)  THERAPY DIAG:  Muscle weakness (generalized)  Abnormal posture  Unspecified lack of coordination  Rationale for Evaluation and Treatment: Rehabilitation  ONSET DATE: 3 years ago  SUBJECTIVE:                                                                                                                                                                                           SUBJECTIVE STATEMENT: Pt reports she has been seeing less leakage overall, needs to wear a pad with jump and quick paced exercises.    Fluid intake: water - 64-90oz; coffee thermos once per day  PAIN:  Are you having pain? No   PRECAUTIONS: None  RED FLAGS: None   WEIGHT BEARING RESTRICTIONS: No  FALLS:  Has patient fallen in last 6 months? No  OCCUPATION: kindergarten teacher   ACTIVITY LEVEL : active 6 days working out  PLOF: Independent  PATIENT GOALS: to have less leakage  PERTINENT HISTORY:  1 vaginal birth Sexual abuse: No  BOWEL MOVEMENT: Pain with bowel movement: No Type of bowel movement:Type (Bristol Stool Scale) 4, Frequency daily,  and Strain no Fully empty rectum: Yes:   Leakage: No Pads: No Fiber supplement/laxative No  URINATION: Pain with urination: No Fully empty bladder: Yes:   Stream: Strong Urgency: No Frequency: not quicker than every 2 hours, not nightly usually sometimes 1x Leakage: Coughing, Sneezing, Laughing, Exercise, Lifting, and Intercourse Pads: Yes: liners changed with going to the bathroom but this for more hygiene not leakage for work but for cardio dayswill have leakage and need larger pad  INTERCOURSE:  Ability to have vaginal penetration No  Pain with intercourse: none DrynessNo Climax:  not painful Marinoff Scale: 0/3  PREGNANCY: Vaginal deliveries 1 Tearing Yes: thinks grade 3 almost 4  Episiotomy No C-section deliveries 0 Currently pregnant No  PROLAPSE: None   OBJECTIVE:  Note: Objective measures were completed at Evaluation unless otherwise noted.  DIAGNOSTIC FINDINGS:     COGNITION: Overall cognitive status: Within functional limits for tasks assessed     SENSATION: Light touch: Appears intact  FUNCTIONAL TESTS:  Functional squat - noted breath holding with return to standing and minimal core activation   GAIT: WFL  POSTURE: rounded shoulders and forward head   LUMBARAROM/PROM:  A/PROM A/PROM  eval  Flexion WFL  Extension WFL  Right lateral flexion WFL  Left lateral flexion WLF  Right rotation Limited by 25%  Left rotation Limited by 25%   (Blank rows = not tested)  LOWER EXTREMITY ROM:  Bil hamstrings and adductors limited by<25%  LOWER EXTREMITY MMT:  WFL but all with noted breath holding and mild upper abdominal bulge PALPATION:   General: no TTP but tight bil lumbar paraspinals  Pelvic Alignment: WFL  Abdominal: no TTP but DRA separation noted with active crunch see chart.                 External Perineal Exam: no TTP                             Internal Pelvic Floor: no TTP, WFL  Patient confirms identification and approves  PT to assess internal pelvic floor and treatment Yes No emotional/communication barriers or cognitive limitation. Patient is motivated to learn. Patient understands and agrees with treatment goals and plan. PT explains patient will be examined in standing, sitting, and lying down to see how their muscles and joints work. When they are ready, they will be asked to remove their underwear so PT can examine their perineum. The patient is also given the option of providing their own chaperone as one is not provided in our facility. The patient also has the right and is explained the right to defer or refuse any part of the evaluation or treatment including the internal exam. With the patient's consent, PT will use one gloved finger to gently assess the muscles of the pelvic floor, seeing how well it contracts and relaxes and if there is muscle symmetry. After, the patient will get dressed and PT and patient will discuss exam findings and plan of care. PT and patient discuss plan of care, schedule, attendance policy and HEP activities.  PELVIC MMT:   MMT eval  Vaginal 2-3/5, 2s, 4 reps  Internal Anal Sphincter   External Anal Sphincter   Puborectalis   Diastasis Recti 1 finger separation below umbilicus, 1.5-2 above with bulge noted with crunch   (Blank rows = not tested)        TONE: WFL  PROLAPSE: Not seen in cough in hooklying   TODAY'S TREATMENT:  DATE:    12/29/23 30# 2x10 squats 15# cables palloffs  2x10 10# opp hip flexion  2x10 15# marios 2x10 each X20 lateral hops 4x4 90 degree hops  20' forward and backward jumps each Pencil jumps x10 progressed to Jumping jacks x10 3x5 squat jumps  Reports roll ups to small jump causes leakage at workouts and this was replicated in slower pace (leakage reported with first rep, then cued for pelvic floor activation with roll  up and no leakage reported, completed x2 more and no leakage)  02/16/24: Pt educated on vaginal weights and reports she has purchased these but not attempted yet Hooklying transverse abdominis activation with max cues for improved activation ability instead of bulging 3x10 - towel roll at low back for improved awareness with good effect Bil shoulder horizontal abduction blue band with transverse abdominis activation x10 - moderate cues  Deadbugs x15 from tabletop Opp hand/knee ball press hooklying x10> tabletop alt x10 Standing hand ball squeeze with transverse abdominis activation 2x10 Palloff blue band rotational x10 X10 squats with pelvic floor contraction and exhale   PATIENT EDUCATION:  Education details: [id number]  Person educated: Patient Education method: Programmer, multimedia, Demonstration, Tactile cues, Verbal cues, and Handouts Education comprehension: verbalized understanding, returned demonstration, verbal cues required, tactile cues required, and needs further education  HOME EXERCISE PROGRAM: [id number]   ASSESSMENT:  CLINICAL IMPRESSION: Patient is a 43 y.o. female  who was seen today for physical therapy  treatment for stress urinary incontinence.  Urinary incontinence has been improving but still needs to wear a pad with jumping, quick paced workouts. Pt session focused on coordination of pelvic floor with workout specific exercises that cause urinary incontinence. Pt tolerated well, benefited from moderate-max verbal cues for coordination with transverse abdominis  but able to demonstrate improvement with cues and demonstration and tactile cues.. Pt very motivated to improve and implement recommendations. Pt would benefit from additional PT to further address deficits.    OBJECTIVE IMPAIRMENTS: decreased activity tolerance, decreased coordination, decreased endurance, decreased mobility, decreased strength, increased fascial restrictions, increased muscle spasms, impaired flexibility,  improper body mechanics, and postural dysfunction.   ACTIVITY LIMITATIONS: continence  PARTICIPATION LIMITATIONS: community activity  PERSONAL FACTORS: Time since onset of injury/illness/exacerbation and 1 comorbidity: 1 vaginal birth are also affecting patient's functional outcome.   REHAB POTENTIAL: Good  CLINICAL DECISION MAKING: Stable/uncomplicated  EVALUATION COMPLEXITY: Low   GOALS: Goals reviewed with patient? Yes  SHORT TERM GOALS: Target date: 01/12/24  Pt to be I with HEP.  Baseline: Goal status: INITIAL  2.  Pt to demonstrate improved coordination of pelvic floor and breathing mechanics with 20# squat with appropriate synergistic patterns to decrease pain and leakage at least 50% of the time.    Baseline:  Goal status: INITIAL  3.  Pt to be I with knack for decreased leakage with stressors.  Baseline:  Goal status: INITIAL  LONG TERM GOALS: Target date: 06/15/24  Pt to be I with advanced HEP.  Baseline:  Goal status: INITIAL  2.  Pt to demonstrate improved coordination of pelvic floor and breathing mechanics with 50# squat with appropriate synergistic patterns to decrease pain and leakage at least 75% of the time.    Baseline:  Goal status: INITIAL  3.  Pt to report no more than one instance of urinary incontinence in a week for improved confidence with working out.  Baseline:  Goal status: INITIAL  4.  Pt to decrease "just in case" urination to improve bladder habits for decreased  frequency to within 2-4 hours.  Baseline:  Goal status: INITIAL  5.  Pt to demonstrate no restrictions with lumbar/pelvic mobility for decreased strain at pelvic floor.  Baseline:  Goal status: INITIAL    PLAN:  PT FREQUENCY: 1-2x/week  PT DURATION: 10 sessions  PLANNED INTERVENTIONS: 97110-Therapeutic exercises, 97530- Therapeutic activity, 97112- Neuromuscular re-education, 97535- Self Care, [id number]- Manual therapy, Patient/Family education, Taping, Dry Needling, Joint  mobilization, Spinal mobilization, Scar mobilization, DME instructions, Cryotherapy, Moist heat, and Biofeedback  PLAN FOR NEXT SESSION: coordination of pelvic floor with breathing and core strengthening; lifting mechanics to decrease breath holding.   Avie Lemme, PT, DPT 06/05/254:20 PM

## 2024-02-23 ENCOUNTER — Ambulatory Visit: Admitting: Physical Therapy

## 2024-03-01 ENCOUNTER — Ambulatory Visit: Admitting: Physical Therapy

## 2024-03-01 DIAGNOSIS — R293 Abnormal posture: Secondary | ICD-10-CM

## 2024-03-01 DIAGNOSIS — M6281 Muscle weakness (generalized): Secondary | ICD-10-CM | POA: Diagnosis not present

## 2024-03-01 DIAGNOSIS — R279 Unspecified lack of coordination: Secondary | ICD-10-CM

## 2024-03-01 NOTE — Therapy (Signed)
 OUTPATIENT PHYSICAL THERAPY FEMALE PELVIC TREATMENT   Patient Name: Kimberly Hickman MRN: [id number] DOB:January 05, 1981, 43 y.o., female Today's Date: 03/01/2024  END OF SESSION:  PT End of Session - 03/01/24 1311     Visit Number 4    Date for PT Re-Evaluation 06/15/24    Authorization Type [employer]    PT Start Time 1146    PT Stop Time 1224    PT Time Calculation (min) 38 min    Activity Tolerance Patient tolerated treatment well    Behavior During Therapy WFL for tasks assessed/performed           Past Medical History:  Diagnosis Date   Elevated cholesterol    Thyroid disease    No past surgical history on file. There are no active problems to display for this patient.   PCP: Naoma Bacca., MD   REFERRING PROVIDER: Lovell Rubenstein, FNP  REFERRING DIAG: N39.3 (ICD-10-CM) - Stress incontinence (female) (female)  THERAPY DIAG:  Muscle weakness (generalized)  Abnormal posture  Unspecified lack of coordination  Rationale for Evaluation and Treatment: Rehabilitation  ONSET DATE: 3 years ago  SUBJECTIVE:                                                                                                                                                                                           SUBJECTIVE STATEMENT: Still leakage with need of pad use for jumping and fast paced exercises   Fluid intake: water - 64-90oz; coffee thermos once per day  PAIN:  Are you having pain? No   PRECAUTIONS: None  RED FLAGS: None   WEIGHT BEARING RESTRICTIONS: No  FALLS:  Has patient fallen in last 6 months? No  OCCUPATION: kindergarten teacher   ACTIVITY LEVEL : active 6 days working out  PLOF: Independent  PATIENT GOALS: to have less leakage  PERTINENT HISTORY:  1 vaginal birth Sexual abuse: No  BOWEL MOVEMENT: Pain with bowel movement: No Type of bowel movement:Type (Bristol Stool Scale) 4, Frequency daily, and Strain no Fully empty rectum: Yes:   Leakage:  No Pads: No Fiber supplement/laxative No  URINATION: Pain with urination: No Fully empty bladder: Yes:   Stream: Strong Urgency: No Frequency: not quicker than every 2 hours, not nightly usually sometimes 1x Leakage: Coughing, Sneezing, Laughing, Exercise, Lifting, and Intercourse Pads: Yes: liners changed with going to the bathroom but this for more hygiene not leakage for work but for cardio dayswill have leakage and need larger pad  INTERCOURSE:  Ability to have vaginal penetration No  Pain with intercourse: none DrynessNo Climax: not painful Marinoff Scale: 0/3  PREGNANCY: Vaginal deliveries 1  Tearing Yes: thinks grade 3 almost 4  Episiotomy No C-section deliveries 0 Currently pregnant No  PROLAPSE: None   OBJECTIVE:  Note: Objective measures were completed at Evaluation unless otherwise noted.  DIAGNOSTIC FINDINGS:     COGNITION: Overall cognitive status: Within functional limits for tasks assessed     SENSATION: Light touch: Appears intact  FUNCTIONAL TESTS:  Functional squat - noted breath holding with return to standing and minimal core activation   GAIT: WFL  POSTURE: rounded shoulders and forward head   LUMBARAROM/PROM:  A/PROM A/PROM  eval  Flexion WFL  Extension WFL  Right lateral flexion WFL  Left lateral flexion WLF  Right rotation Limited by 25%  Left rotation Limited by 25%   (Blank rows = not tested)  LOWER EXTREMITY ROM:  Bil hamstrings and adductors limited by<25%  LOWER EXTREMITY MMT:  WFL but all with noted breath holding and mild upper abdominal bulge PALPATION:   General: no TTP but tight bil lumbar paraspinals  Pelvic Alignment: WFL  Abdominal: no TTP but DRA separation noted with active crunch see chart.                 External Perineal Exam: no TTP                             Internal Pelvic Floor: no TTP, WFL  Patient confirms identification and approves PT to assess internal pelvic floor and treatment  Yes No emotional/communication barriers or cognitive limitation. Patient is motivated to learn. Patient understands and agrees with treatment goals and plan. PT explains patient will be examined in standing, sitting, and lying down to see how their muscles and joints work. When they are ready, they will be asked to remove their underwear so PT can examine their perineum. The patient is also given the option of providing their own chaperone as one is not provided in our facility. The patient also has the right and is explained the right to defer or refuse any part of the evaluation or treatment including the internal exam. With the patient's consent, PT will use one gloved finger to gently assess the muscles of the pelvic floor, seeing how well it contracts and relaxes and if there is muscle symmetry. After, the patient will get dressed and PT and patient will discuss exam findings and plan of care. PT and patient discuss plan of care, schedule, attendance policy and HEP activities.  PELVIC MMT:   MMT eval  Vaginal 2-3/5, 2s, 4 reps  Internal Anal Sphincter   External Anal Sphincter   Puborectalis   Diastasis Recti 1 finger separation below umbilicus, 1.5-2 above with bulge noted with crunch   (Blank rows = not tested)        TONE: WFL  PROLAPSE: Not seen in cough in hooklying   TODAY'S TREATMENT:  DATE:    12/29/23 30# 2x10 squats 15# cables palloffs  2x10 10# opp hip flexion  2x10 15# marios 2x10 each X20 lateral hops 4x4 90 degree hops  20' forward and backward jumps each Pencil jumps x10 progressed to Jumping jacks x10 3x5 squat jumps  Reports roll ups to small jump causes leakage at workouts and this was replicated in slower pace (leakage reported with first rep, then cued for pelvic floor activation with roll up and no leakage reported, completed x2 more and  no leakage)  02/16/24: Pt educated on vaginal weights and reports she has purchased these but not attempted yet Hooklying transverse abdominis activation with max cues for improved activation ability instead of bulging 3x10 - towel roll at low back for improved awareness with good effect Bil shoulder horizontal abduction blue band with transverse abdominis activation x10 - moderate cues  Deadbugs x15 from tabletop Opp hand/knee ball press hooklying x10> tabletop alt x10 Standing hand ball squeeze with transverse abdominis activation 2x10 Palloff blue band rotational x10 X10 squats with pelvic floor contraction and exhale  03/01/24: X10 crunches with noted arch at back and doming at upper abs - tactile and verbal cues for improved techniques with pt able to complete without arches or doming for 3 reps however with fatigue doming slightly returned Pt educated on towel correction with core approximation in sit up/crunch technique to decreased doming at abdomen  Greatly improved pt's awareness of this - updated to include this in HEP Pt also educated on how to manually feel her transverse abdominis herself to make sure it is activated and pt able to do this during these exercises X10 pike jumps with mat for UE support (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   PATIENT EDUCATION:  Education details: [id number]  Person educated: Patient Education  method: Explanation, Demonstration, Tactile cues, Verbal cues, and Handouts Education comprehension: verbalized understanding, returned demonstration, verbal cues required, tactile cues required, and needs further education  HOME EXERCISE PROGRAM: [id number]   ASSESSMENT:  CLINICAL IMPRESSION: Patient is a 43 y.o. female  who was seen today for physical therapy  treatment for stress urinary incontinence.  Urinary incontinence has been improving but still needs to wear a pad with jumping, quick paced workouts. Pt session focused on coordination of pelvic floor with workout specific exercises that cause urinary incontinence. Pt tolerated well, benefited from moderate verbal cues for coordination with transverse abdominis and upper core but able to demonstrate improvement with cues and demonstration and tactile cues. Denied allergies to tape/adhesives and educated on how/when to remove tape if needed or starts to fray but k tape applied by skilled PT for tissue approximation for core strengthening, pt reports she was more able to feel this after placement.  Pt very motivated to improve and implement recommendations. Pt would benefit from additional PT to further address deficits.    OBJECTIVE IMPAIRMENTS: decreased activity tolerance, decreased coordination, decreased endurance, decreased mobility, decreased  strength, increased fascial restrictions, increased muscle spasms, impaired flexibility, improper body mechanics, and postural dysfunction.   ACTIVITY LIMITATIONS: continence  PARTICIPATION LIMITATIONS: community activity  PERSONAL FACTORS: Time since onset of injury/illness/exacerbation and 1 comorbidity: 1 vaginal birth are also affecting patient's functional outcome.   REHAB POTENTIAL: Good  CLINICAL DECISION MAKING: Stable/uncomplicated  EVALUATION COMPLEXITY: Low   GOALS: Goals reviewed with patient? Yes  SHORT TERM GOALS: Target date: 01/12/24  Pt to be I with HEP.  Baseline: Goal  status: INITIAL  2.  Pt to demonstrate improved coordination of pelvic floor and breathing mechanics with 20# squat with appropriate synergistic patterns to decrease pain and leakage at least 50% of the time.    Baseline:  Goal status: INITIAL  3.  Pt to be I with knack for decreased leakage with stressors.  Baseline:  Goal status: INITIAL  LONG TERM GOALS: Target date: 06/15/24  Pt to be I with advanced HEP.  Baseline:  Goal status: INITIAL  2.  Pt to demonstrate improved coordination of pelvic floor and breathing mechanics with 50# squat with appropriate synergistic patterns to decrease pain and leakage at least 75% of the time.    Baseline:  Goal status: INITIAL  3.  Pt to report no more than one instance of urinary incontinence in a week for improved confidence with working out.  Baseline:  Goal status: INITIAL  4.  Pt to decrease just in case urination to improve bladder habits for decreased frequency to within 2-4 hours.  Baseline:  Goal status: INITIAL  5.  Pt to demonstrate no restrictions with lumbar/pelvic mobility for decreased strain at pelvic floor.  Baseline:  Goal status: INITIAL    PLAN:  PT FREQUENCY: 1-2x/week  PT DURATION: 10 sessions  PLANNED INTERVENTIONS: 97110-Therapeutic exercises, 97530- Therapeutic activity, 97112- Neuromuscular re-education, 97535- Self Care, [id number]- Manual therapy, Patient/Family education, Taping, Dry Needling, Joint mobilization, Spinal mobilization, Scar mobilization, DME instructions, Cryotherapy, Moist heat, and Biofeedback  PLAN FOR NEXT SESSION: coordination of pelvic floor with breathing and core strengthening; lifting mechanics to decrease breath holding.   Avie Lemme, PT, DPT 06/19/251:13 PM

## 2024-03-26 ENCOUNTER — Encounter: Admitting: Physical Therapy

## 2024-03-28 ENCOUNTER — Ambulatory Visit: Admitting: Physical Therapy

## 2024-03-30 ENCOUNTER — Encounter: Payer: Self-pay | Admitting: Physical Therapy

## 2024-03-30 ENCOUNTER — Ambulatory Visit: Attending: Obstetrics and Gynecology | Admitting: Physical Therapy

## 2024-03-30 DIAGNOSIS — R293 Abnormal posture: Secondary | ICD-10-CM | POA: Diagnosis present

## 2024-03-30 DIAGNOSIS — M6281 Muscle weakness (generalized): Secondary | ICD-10-CM | POA: Diagnosis present

## 2024-03-30 DIAGNOSIS — R279 Unspecified lack of coordination: Secondary | ICD-10-CM | POA: Diagnosis present

## 2024-03-30 NOTE — Therapy (Signed)
 OUTPATIENT PHYSICAL THERAPY FEMALE PELVIC TREATMENT   Patient Name: Kimberly Hickman MRN: [id number] DOB:May 21, 1981, 43 y.o., female Today's Date: 03/30/2024  END OF SESSION:  PT End of Session - 03/30/24 1042     Visit Number 5    Date for PT Re-Evaluation 06/15/24    Authorization Type [employer]    PT Start Time 1015    PT Stop Time 1100    PT Time Calculation (min) 45 min    Activity Tolerance Patient tolerated treatment well    Behavior During Therapy WFL for tasks assessed/performed           Past Medical History:  Diagnosis Date   Elevated cholesterol    Thyroid disease    History reviewed. No pertinent surgical history. There are no active problems to display for this patient.   PCP: Deane Camie HERO., MD   REFERRING PROVIDER: Kristie Staggers, FNP  REFERRING DIAG: N39.3 (ICD-10-CM) - Stress incontinence (female) (female)  THERAPY DIAG:  Muscle weakness (generalized)  Abnormal posture  Unspecified lack of coordination  Rationale for Evaluation and Treatment: Rehabilitation  ONSET DATE: 3 years ago  SUBJECTIVE:                                                                                                                                                                                           SUBJECTIVE STATEMENT: Still leakage with need of pad use for jumping and fast paced exercises   Fluid intake: water - 64-90oz; coffee thermos once per day  PAIN:  Are you having pain? No   PRECAUTIONS: None  RED FLAGS: None   WEIGHT BEARING RESTRICTIONS: No  FALLS:  Has patient fallen in last 6 months? No  OCCUPATION: kindergarten teacher   ACTIVITY LEVEL : active 6 days working out  PLOF: Independent  PATIENT GOALS: to have less leakage  PERTINENT HISTORY:  1 vaginal birth Sexual abuse: No  BOWEL MOVEMENT: Pain with bowel movement: No Type of bowel movement:Type (Bristol Stool Scale) 4, Frequency daily, and Strain no Fully empty rectum: Yes:    Leakage: No Pads: No Fiber supplement/laxative No  URINATION: Pain with urination: No Fully empty bladder: Yes:   Stream: Strong Urgency: No Frequency: not quicker than every 2 hours, not nightly usually sometimes 1x Leakage: Coughing, Sneezing, Laughing, Exercise, Lifting, and Intercourse Pads: Yes: liners changed with going to the bathroom but this for more hygiene not leakage for work but for cardio dayswill have leakage and need larger pad  INTERCOURSE:  Ability to have vaginal penetration No  Pain with intercourse: none DrynessNo Climax: not painful Marinoff Scale: 0/3  PREGNANCY: Vaginal deliveries 1  Tearing Yes: thinks grade 3 almost 4  Episiotomy No C-section deliveries 0 Currently pregnant No  PROLAPSE: None   OBJECTIVE:  Note: Objective measures were completed at Evaluation unless otherwise noted.  DIAGNOSTIC FINDINGS:     COGNITION: Overall cognitive status: Within functional limits for tasks assessed     SENSATION: Light touch: Appears intact  FUNCTIONAL TESTS:  Functional squat - noted breath holding with return to standing and minimal core activation   GAIT: WFL  POSTURE: rounded shoulders and forward head   LUMBARAROM/PROM:  A/PROM A/PROM  eval  Flexion WFL  Extension WFL  Right lateral flexion WFL  Left lateral flexion WLF  Right rotation Limited by 25%  Left rotation Limited by 25%   (Blank rows = not tested)  LOWER EXTREMITY ROM:  Bil hamstrings and adductors limited by<25%  LOWER EXTREMITY MMT:  WFL but all with noted breath holding and mild upper abdominal bulge PALPATION:   General: no TTP but tight bil lumbar paraspinals  Pelvic Alignment: WFL  Abdominal: no TTP but DRA separation noted with active crunch see chart.                 External Perineal Exam: no TTP                             Internal Pelvic Floor: no TTP, WFL  Patient confirms identification and approves PT to assess internal pelvic floor and  treatment Yes No emotional/communication barriers or cognitive limitation. Patient is motivated to learn. Patient understands and agrees with treatment goals and plan. PT explains patient will be examined in standing, sitting, and lying down to see how their muscles and joints work. When they are ready, they will be asked to remove their underwear so PT can examine their perineum. The patient is also given the option of providing their own chaperone as one is not provided in our facility. The patient also has the right and is explained the right to defer or refuse any part of the evaluation or treatment including the internal exam. With the patient's consent, PT will use one gloved finger to gently assess the muscles of the pelvic floor, seeing how well it contracts and relaxes and if there is muscle symmetry. After, the patient will get dressed and PT and patient will discuss exam findings and plan of care. PT and patient discuss plan of care, schedule, attendance policy and HEP activities.  PELVIC MMT:   MMT eval 03/30/24  Vaginal 2-3/5, 2s, 4 reps 4/5, 1s, 1 rep - most average (3/5, 6s, 6 reps)  Internal Anal Sphincter    External Anal Sphincter    Puborectalis    Diastasis Recti 1 finger separation below umbilicus, 1.5-2 above with bulge noted with crunch    (Blank rows = not tested)        TONE: WFL  PROLAPSE: Not seen in cough in hooklying   TODAY'S TREATMENT:  DATE:    12/29/23 30# 2x10 squats 15# cables palloffs  2x10 10# opp hip flexion  2x10 15# marios 2x10 each X20 lateral hops 4x4 90 degree hops  20' forward and backward jumps each Pencil jumps x10 progressed to Jumping jacks x10 3x5 squat jumps  Reports roll ups to small jump causes leakage at workouts and this was replicated in slower pace (leakage reported with first rep, then cued for pelvic floor  activation with roll up and no leakage reported, completed x2 more and no leakage)  02/16/24: Pt educated on vaginal weights and reports she has purchased these but not attempted yet Hooklying transverse abdominis activation with max cues for improved activation ability instead of bulging 3x10 - towel roll at low back for improved awareness with good effect Bil shoulder horizontal abduction blue band with transverse abdominis activation x10 - moderate cues  Deadbugs x15 from tabletop Opp hand/knee ball press hooklying x10> tabletop alt x10 Standing hand ball squeeze with transverse abdominis activation 2x10 Palloff blue band rotational x10 X10 squats with pelvic floor contraction and exhale  03/01/24: X10 crunches with noted arch at back and doming at upper abs - tactile and verbal cues for improved techniques with pt able to complete without arches or doming for 3 reps however with fatigue doming slightly returned Pt educated on towel correction with core approximation in sit up/crunch technique to decreased doming at abdomen  Greatly improved pt's awareness of this - updated to include this in HEP Pt also educated on how to manually feel her transverse abdominis herself to make sure it is activated and pt able to do this during these exercises X10 pike jumps with mat for UE support (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   03/30/24: Patient  consented to internal pelvic floor reassessment/treatment vaginally this date and found to have improved strength, endurance, and coordination. Findings above in chart.  2x10 pelvic floor contractions 3x10 quick flicks X10 isometrics 10s (does have lesser strength with contraction wavering but can hold a contraction for full 10s) Pt educated on progressing workouts for improved tolerance to running without leakage with single leg strengthening and hopping/jumping pt reports she is able to do this with trainer as well and feels comfortable doing this.       PATIENT EDUCATION:  Education details: [id number]  Person educated: Patient Education method: Explanation, Demonstration, Tactile cues, Verbal cues, and Handouts Education comprehension: verbalized understanding, returned demonstration, verbal cues required, tactile cues required, and needs further education  HOME EXERCISE PROGRAM: [id number]   ASSESSMENT:  CLINICAL IMPRESSION: Patient is a 43 y.o. female  who was seen today for physical therapy  treatment for stress urinary incontinence.  Urinary incontinence has been improving but still needs to wear a pad with jumping, quick paced workouts. Pt session focused on reassessing pelvic floor vaginally today, findings to be improved greatly from eval. Pt does continue to have Drops of urinary incontinence with jumping or  running but greatly improving. Pt also using vaginal weights at home now too. Pt very motivated to improve and implement recommendations. Progressing toward all goals and plans to return after one month to give her time to self assess improvement with all recommendations. Pt would benefit from additional PT to further address deficits.    OBJECTIVE IMPAIRMENTS: decreased activity tolerance, decreased coordination, decreased endurance, decreased mobility, decreased strength, increased fascial restrictions, increased muscle spasms, impaired flexibility, improper body mechanics, and  postural dysfunction.   ACTIVITY LIMITATIONS: continence  PARTICIPATION LIMITATIONS: community activity  PERSONAL FACTORS: Time since onset of injury/illness/exacerbation and 1 comorbidity: 1 vaginal birth are also affecting patient's functional outcome.   REHAB POTENTIAL: Good  CLINICAL DECISION MAKING: Stable/uncomplicated  EVALUATION COMPLEXITY: Low   GOALS: Goals reviewed with patient? Yes  SHORT TERM GOALS: Target date: 01/12/24  Pt to be I with HEP.  Baseline: Goal status: MET  2.  Pt to demonstrate improved coordination of pelvic floor and breathing mechanics with 20# squat with appropriate synergistic patterns to decrease pain and leakage at least 50% of the time.    Baseline:  Goal status: MET  3.  Pt to be I with knack for decreased leakage with stressors.  Baseline:  Goal status: MET  LONG TERM GOALS: Target date: 06/15/24  Pt to be I with advanced HEP.  Baseline:  Goal status: on going  2.  Pt to demonstrate improved coordination of pelvic floor and breathing mechanics with 50# squat with appropriate synergistic patterns to decrease pain and leakage at least 75% of the time.    Baseline:  Goal status: MET  3.  Pt to report no more than one instance of urinary incontinence in a week for improved confidence with working out.  Baseline:  Goal status: on going  4.  Pt to decrease just in case urination to improve bladder habits for decreased frequency to within 2-4 hours.  Baseline:  Goal status: MET  5.  Pt to demonstrate no restrictions with lumbar/pelvic mobility for decreased strain at pelvic floor.  Baseline:  Goal status: MET    PLAN:  PT FREQUENCY: 1-2x/week  PT DURATION: 10 sessions  PLANNED INTERVENTIONS: 97110-Therapeutic exercises, 97530- Therapeutic activity, 97112- Neuromuscular re-education, 97535- Self Care, [id number]- Manual therapy, Patient/Family education, Taping, Dry Needling, Joint mobilization, Spinal mobilization, Scar  mobilization, DME instructions, Cryotherapy, Moist heat, and Biofeedback  PLAN FOR NEXT SESSION: coordination of pelvic floor with breathing and core strengthening; lifting mechanics to decrease breath holding.   Darryle Navy, PT, DPT 03/30/2510:04 AM

## 2024-04-02 ENCOUNTER — Ambulatory Visit: Admitting: Physical Therapy

## 2024-04-04 ENCOUNTER — Ambulatory Visit: Admitting: Physical Therapy

## 2024-04-09 ENCOUNTER — Encounter: Admitting: Physical Therapy

## 2024-04-11 ENCOUNTER — Encounter: Admitting: Physical Therapy

## 2024-04-16 ENCOUNTER — Ambulatory Visit: Admitting: Physical Therapy

## 2024-06-11 ENCOUNTER — Ambulatory Visit: Admitting: Physical Therapy
